# Patient Record
Sex: Female | Born: 1960 | Race: White | Hispanic: No | Marital: Married | Smoking: Never smoker
Health system: Southern US, Community
[De-identification: ages and names within clinical notes are randomized; demographics above are authoritative.]

## PROBLEM LIST (undated history)

## (undated) DIAGNOSIS — M1990 Unspecified osteoarthritis, unspecified site: Secondary | ICD-10-CM

## (undated) DIAGNOSIS — F419 Anxiety disorder, unspecified: Secondary | ICD-10-CM

## (undated) DIAGNOSIS — F32A Depression, unspecified: Secondary | ICD-10-CM

## (undated) DIAGNOSIS — T7840XA Allergy, unspecified, initial encounter: Secondary | ICD-10-CM

## (undated) DIAGNOSIS — I471 Supraventricular tachycardia, unspecified: Secondary | ICD-10-CM

## (undated) HISTORY — DX: Unspecified osteoarthritis, unspecified site: M19.90

## (undated) HISTORY — DX: Allergy, unspecified, initial encounter: T78.40XA

## (undated) HISTORY — PX: COLONOSCOPY: SHX174

## (undated) HISTORY — DX: Anxiety disorder, unspecified: F41.9

## (undated) HISTORY — DX: Depression, unspecified: F32.A

---

## 1992-11-19 HISTORY — PX: WISDOM TOOTH EXTRACTION: SHX21

## 1998-01-18 ENCOUNTER — Other Ambulatory Visit: Admission: RE | Admit: 1998-01-18 | Discharge: 1998-01-18 | Payer: Self-pay | Admitting: Obstetrics and Gynecology

## 1998-06-06 ENCOUNTER — Other Ambulatory Visit: Admission: RE | Admit: 1998-06-06 | Discharge: 1998-06-06 | Payer: Self-pay | Admitting: Obstetrics and Gynecology

## 1999-06-01 ENCOUNTER — Other Ambulatory Visit: Admission: RE | Admit: 1999-06-01 | Discharge: 1999-06-01 | Payer: Self-pay | Admitting: Obstetrics and Gynecology

## 1999-12-01 ENCOUNTER — Other Ambulatory Visit: Admission: RE | Admit: 1999-12-01 | Discharge: 1999-12-01 | Payer: Self-pay | Admitting: Obstetrics and Gynecology

## 2001-03-14 ENCOUNTER — Other Ambulatory Visit: Admission: RE | Admit: 2001-03-14 | Discharge: 2001-03-14 | Payer: Self-pay | Admitting: Obstetrics and Gynecology

## 2002-02-13 ENCOUNTER — Other Ambulatory Visit: Admission: RE | Admit: 2002-02-13 | Discharge: 2002-02-13 | Payer: Self-pay | Admitting: Obstetrics and Gynecology

## 2002-10-01 ENCOUNTER — Other Ambulatory Visit: Admission: RE | Admit: 2002-10-01 | Discharge: 2002-10-01 | Payer: Self-pay | Admitting: Obstetrics and Gynecology

## 2003-10-22 ENCOUNTER — Other Ambulatory Visit: Admission: RE | Admit: 2003-10-22 | Discharge: 2003-10-22 | Payer: Self-pay | Admitting: Obstetrics and Gynecology

## 2005-01-26 ENCOUNTER — Other Ambulatory Visit: Admission: RE | Admit: 2005-01-26 | Discharge: 2005-01-26 | Payer: Self-pay | Admitting: Obstetrics and Gynecology

## 2005-09-28 ENCOUNTER — Encounter: Admission: RE | Admit: 2005-09-28 | Discharge: 2005-09-28 | Payer: Self-pay | Admitting: Obstetrics and Gynecology

## 2005-10-09 ENCOUNTER — Encounter: Admission: RE | Admit: 2005-10-09 | Discharge: 2005-10-09 | Payer: Self-pay | Admitting: Obstetrics and Gynecology

## 2006-03-01 ENCOUNTER — Other Ambulatory Visit: Admission: RE | Admit: 2006-03-01 | Discharge: 2006-03-01 | Payer: Self-pay | Admitting: Obstetrics and Gynecology

## 2006-10-04 ENCOUNTER — Encounter: Admission: RE | Admit: 2006-10-04 | Discharge: 2006-10-04 | Payer: Self-pay | Admitting: Obstetrics and Gynecology

## 2007-03-24 ENCOUNTER — Encounter: Admission: RE | Admit: 2007-03-24 | Discharge: 2007-03-24 | Payer: Self-pay | Admitting: Obstetrics and Gynecology

## 2009-06-09 ENCOUNTER — Encounter: Admission: RE | Admit: 2009-06-09 | Discharge: 2009-06-09 | Payer: Self-pay | Admitting: Obstetrics and Gynecology

## 2009-06-22 ENCOUNTER — Ambulatory Visit: Payer: Self-pay | Admitting: Internal Medicine

## 2009-07-05 ENCOUNTER — Ambulatory Visit: Payer: Self-pay | Admitting: Internal Medicine

## 2009-07-05 ENCOUNTER — Encounter: Payer: Self-pay | Admitting: Internal Medicine

## 2009-07-07 ENCOUNTER — Encounter: Payer: Self-pay | Admitting: Internal Medicine

## 2010-06-15 ENCOUNTER — Encounter: Admission: RE | Admit: 2010-06-15 | Discharge: 2010-06-15 | Payer: Self-pay | Admitting: Obstetrics and Gynecology

## 2010-06-20 ENCOUNTER — Encounter: Admission: RE | Admit: 2010-06-20 | Discharge: 2010-06-20 | Payer: Self-pay | Admitting: Obstetrics and Gynecology

## 2010-12-09 ENCOUNTER — Encounter: Payer: Self-pay | Admitting: Obstetrics and Gynecology

## 2011-05-24 ENCOUNTER — Other Ambulatory Visit: Payer: Self-pay | Admitting: Obstetrics and Gynecology

## 2011-05-24 DIAGNOSIS — Z1231 Encounter for screening mammogram for malignant neoplasm of breast: Secondary | ICD-10-CM

## 2011-06-26 ENCOUNTER — Ambulatory Visit
Admission: RE | Admit: 2011-06-26 | Discharge: 2011-06-26 | Disposition: A | Discharge status: Home / Self Care | Payer: BC Managed Care – PPO | Source: Ambulatory Visit | Attending: Obstetrics and Gynecology | Admitting: Obstetrics and Gynecology

## 2011-06-26 DIAGNOSIS — Z1231 Encounter for screening mammogram for malignant neoplasm of breast: Secondary | ICD-10-CM

## 2012-04-25 ENCOUNTER — Other Ambulatory Visit: Payer: Self-pay | Admitting: Obstetrics and Gynecology

## 2012-04-25 DIAGNOSIS — N63 Unspecified lump in unspecified breast: Secondary | ICD-10-CM

## 2012-05-02 ENCOUNTER — Ambulatory Visit
Admission: RE | Admit: 2012-05-02 | Discharge: 2012-05-02 | Disposition: A | Discharge status: Home / Self Care | Payer: BC Managed Care – PPO | Source: Ambulatory Visit | Attending: Obstetrics and Gynecology | Admitting: Obstetrics and Gynecology

## 2012-05-02 DIAGNOSIS — N63 Unspecified lump in unspecified breast: Secondary | ICD-10-CM

## 2013-04-27 ENCOUNTER — Other Ambulatory Visit: Payer: Self-pay

## 2013-04-27 DIAGNOSIS — Z1231 Encounter for screening mammogram for malignant neoplasm of breast: Secondary | ICD-10-CM

## 2013-05-27 ENCOUNTER — Other Ambulatory Visit: Payer: Self-pay | Admitting: Obstetrics and Gynecology

## 2013-05-27 ENCOUNTER — Ambulatory Visit
Admission: RE | Admit: 2013-05-27 | Discharge: 2013-05-27 | Disposition: A | Discharge status: Home / Self Care | Payer: BC Managed Care – PPO | Source: Ambulatory Visit

## 2013-05-27 DIAGNOSIS — Z1231 Encounter for screening mammogram for malignant neoplasm of breast: Secondary | ICD-10-CM

## 2013-05-27 DIAGNOSIS — N644 Mastodynia: Secondary | ICD-10-CM

## 2013-05-27 DIAGNOSIS — N63 Unspecified lump in unspecified breast: Secondary | ICD-10-CM

## 2013-06-09 ENCOUNTER — Ambulatory Visit
Admission: RE | Admit: 2013-06-09 | Discharge: 2013-06-09 | Disposition: A | Discharge status: Home / Self Care | Payer: BC Managed Care – PPO | Source: Ambulatory Visit | Attending: Obstetrics and Gynecology | Admitting: Obstetrics and Gynecology

## 2013-06-09 DIAGNOSIS — N644 Mastodynia: Secondary | ICD-10-CM

## 2014-01-28 ENCOUNTER — Emergency Department (INDEPENDENT_AMBULATORY_CARE_PROVIDER_SITE_OTHER)
Admission: EM | Admit: 2014-01-28 | Discharge: 2014-01-28 | Disposition: A | Discharge status: Home / Self Care | Payer: Worker's Compensation | Source: Home / Self Care

## 2014-01-28 ENCOUNTER — Emergency Department (INDEPENDENT_AMBULATORY_CARE_PROVIDER_SITE_OTHER): Payer: Worker's Compensation

## 2014-01-28 ENCOUNTER — Encounter (HOSPITAL_COMMUNITY): Payer: Self-pay | Admitting: Emergency Medicine

## 2014-01-28 DIAGNOSIS — M79609 Pain in unspecified limb: Secondary | ICD-10-CM

## 2014-01-28 DIAGNOSIS — M79641 Pain in right hand: Secondary | ICD-10-CM

## 2014-01-28 DIAGNOSIS — M533 Sacrococcygeal disorders, not elsewhere classified: Secondary | ICD-10-CM

## 2014-01-28 HISTORY — DX: Supraventricular tachycardia: I47.1

## 2014-01-28 HISTORY — DX: Supraventricular tachycardia, unspecified: I47.10

## 2014-01-28 NOTE — ED Provider Notes (Signed)
Medical screening examination/treatment/procedure(s) were performed by resident physician or non-physician practitioner and as supervising physician I was immediately available for consultation/collaboration.   Pauline Good MD.   Billy Fischer, MD 01/28/14 [phone]

## 2014-01-28 NOTE — ED Provider Notes (Signed)
CSN: [id number]     Arrival date & time 01/28/14  1711 History   None    Chief Complaint  Patient presents with  . Hand Injury   (Consider location/radiation/quality/duration/timing/severity/associated sxs/prior Treatment) HPI Comments: 54 yo female slipped and fell at work on hard floor. She landed on her butt and right hand. She notes she did hit her head but denies LOC or pain or neuro changes. She notes pain in base of right thumb and fall was with hand flexed backwards.She is right hand dominant.She notes coccyx is sore when climbs stairs or sits for long periods of time. She denies radiation of pain in either area.  Patient is a 53 y.o. female presenting with hand injury.  Hand Injury   Past Medical History  Diagnosis Date  . SVT (supraventricular tachycardia)    Past Surgical History  Procedure Laterality Date  . Cesarean section     No family history on file. History  Substance Use Topics  . Smoking status: Never Smoker   . Smokeless tobacco: Not on file  . Alcohol Use: Yes     Comment: occasionally   OB History   Grav Para Term Preterm Abortions TAB SAB Ect Mult Living                 Review of Systems  Musculoskeletal:       Hand/ coccyx pain  All other systems reviewed and are negative.    Allergies  Review of patient's allergies indicates no known allergies.  Home Medications   Current Outpatient Rx  Name  Route  Sig  Dispense  Refill  . cetirizine (ZYRTEC) 10 MG tablet   Oral   Take 10 mg by mouth daily.         Marland Kitchen estradiol-norethindrone (COMBIPATCH) 0.05-0.14 MG/DAY   Transdermal   Place 1 patch onto the skin 2 (two) times a week.         Marland Kitchen ibuprofen (ADVIL,MOTRIN) 100 MG tablet   Oral   Take 100 mg by mouth every 6 (six) hours as needed for fever.          BP 145/79  Pulse 89  Temp(Src) 98.7 F (37.1 C)  Resp 16  SpO2 100% Physical Exam  Nursing note and vitals reviewed. Constitutional: She is oriented to person, place, and  time. She appears well-developed and well-nourished.  HENT:  Head: Normocephalic and atraumatic.  Right Ear: External ear normal.  Left Ear: External ear normal.  Nose: Nose normal.  Mouth/Throat: Oropharynx is clear and moist.  Eyes: Conjunctivae and EOM are normal. Pupils are equal, round, and reactive to light.  Neck: Normal range of motion. Neck supple.  Cardiovascular: Normal rate, regular rhythm, normal heart sounds and intact distal pulses.   Pulmonary/Chest: Effort normal and breath sounds normal.  Abdominal: Soft. Bowel sounds are normal. She exhibits no distension. There is no tenderness.  Musculoskeletal: She exhibits edema and tenderness.  Base of right thumb  Neurological: She is alert and oriented to person, place, and time. She has normal reflexes. No cranial nerve deficit. Coordination normal.  Skin: Skin is warm and dry.  Psychiatric: She has a normal mood and affect. Judgment normal.    ED Course  Procedures (including critical care time) Labs Review Labs Reviewed - No data to display Imaging Review Dg Hand Complete Right  01/28/2014   CLINICAL DATA:  Traumatic injury and pain  EXAM: RIGHT HAND - COMPLETE 3+ VIEW  COMPARISON:  None.  FINDINGS: Mild  degenerative changes of the first MCP joint are seen. No acute fracture or dislocation is noted. No gross soft tissue abnormality is noted.  IMPRESSION: Mild degenerative change without acute abnormality.   Electronically Signed   By: Inez Catalina M.D.   On: 01/28/2014 18:16     MDM  1. Right hand pain s/p fall- advised R.I.C.E, f/u if symptoms in crease for referral to hand specialist, ADvised OTC ADvil/ Tylenol AD 2. ? Bruised Coccyx- will monitor for now if symptoms increase f/u for Xray. Advised use cushion or donut pillow.     Ardis Hughs, PA-C 01/28/14 1926

## 2014-01-28 NOTE — ED Notes (Signed)
Patient states she fell this morning landing on her right hand and tail bone; states pain in tail bone is worse while sitting and going up stairs; states her hand hurts and can not mover her right thumb.

## 2014-04-21 ENCOUNTER — Encounter: Payer: Self-pay | Admitting: Internal Medicine

## 2014-05-24 ENCOUNTER — Other Ambulatory Visit: Payer: Self-pay

## 2014-05-24 DIAGNOSIS — Z1231 Encounter for screening mammogram for malignant neoplasm of breast: Secondary | ICD-10-CM

## 2014-06-10 ENCOUNTER — Encounter (INDEPENDENT_AMBULATORY_CARE_PROVIDER_SITE_OTHER): Payer: Self-pay

## 2014-06-10 ENCOUNTER — Ambulatory Visit
Admission: RE | Admit: 2014-06-10 | Discharge: 2014-06-10 | Disposition: A | Discharge status: Home / Self Care | Payer: BC Managed Care – PPO | Source: Ambulatory Visit

## 2014-06-10 DIAGNOSIS — Z1231 Encounter for screening mammogram for malignant neoplasm of breast: Secondary | ICD-10-CM

## 2014-06-30 ENCOUNTER — Other Ambulatory Visit: Payer: Self-pay | Admitting: Obstetrics and Gynecology

## 2014-07-01 LAB — CYTOLOGY - PAP

## 2014-11-19 HISTORY — PX: HAND SURGERY: SHX662

## 2015-05-12 ENCOUNTER — Encounter: Payer: Self-pay | Admitting: Internal Medicine

## 2015-07-01 ENCOUNTER — Other Ambulatory Visit: Payer: Self-pay

## 2015-07-01 DIAGNOSIS — Z1231 Encounter for screening mammogram for malignant neoplasm of breast: Secondary | ICD-10-CM

## 2015-07-04 ENCOUNTER — Other Ambulatory Visit: Payer: Self-pay | Admitting: Obstetrics and Gynecology

## 2015-07-05 LAB — CYTOLOGY - PAP

## 2015-07-11 ENCOUNTER — Ambulatory Visit
Admission: RE | Admit: 2015-07-11 | Discharge: 2015-07-11 | Disposition: A | Discharge status: Home / Self Care | Payer: BC Managed Care – PPO | Source: Ambulatory Visit

## 2015-07-11 DIAGNOSIS — Z1231 Encounter for screening mammogram for malignant neoplasm of breast: Secondary | ICD-10-CM

## 2016-06-25 ENCOUNTER — Other Ambulatory Visit: Payer: Self-pay | Admitting: Obstetrics and Gynecology

## 2016-06-25 DIAGNOSIS — Z1231 Encounter for screening mammogram for malignant neoplasm of breast: Secondary | ICD-10-CM

## 2016-07-17 ENCOUNTER — Ambulatory Visit: Payer: Self-pay

## 2016-07-17 ENCOUNTER — Ambulatory Visit
Admission: RE | Admit: 2016-07-17 | Discharge: 2016-07-17 | Disposition: A | Discharge status: Home / Self Care | Payer: BC Managed Care – PPO | Source: Ambulatory Visit | Attending: Obstetrics and Gynecology | Admitting: Obstetrics and Gynecology

## 2016-07-17 DIAGNOSIS — Z1231 Encounter for screening mammogram for malignant neoplasm of breast: Secondary | ICD-10-CM

## 2017-07-11 ENCOUNTER — Other Ambulatory Visit: Payer: Self-pay | Admitting: Obstetrics and Gynecology

## 2017-07-11 DIAGNOSIS — Z1231 Encounter for screening mammogram for malignant neoplasm of breast: Secondary | ICD-10-CM

## 2017-08-01 ENCOUNTER — Ambulatory Visit: Payer: Self-pay

## 2017-08-09 ENCOUNTER — Inpatient Hospital Stay: Admission: RE | Admit: 2017-08-09 | Payer: Self-pay | Source: Ambulatory Visit

## 2017-10-04 ENCOUNTER — Ambulatory Visit
Admission: RE | Admit: 2017-10-04 | Discharge: 2017-10-04 | Disposition: A | Discharge status: Home / Self Care | Payer: BC Managed Care – PPO | Source: Ambulatory Visit | Attending: Obstetrics and Gynecology | Admitting: Obstetrics and Gynecology

## 2017-10-04 DIAGNOSIS — Z1231 Encounter for screening mammogram for malignant neoplasm of breast: Secondary | ICD-10-CM

## 2018-09-10 ENCOUNTER — Other Ambulatory Visit: Payer: Self-pay | Admitting: Obstetrics and Gynecology

## 2018-09-10 DIAGNOSIS — Z1231 Encounter for screening mammogram for malignant neoplasm of breast: Secondary | ICD-10-CM

## 2018-10-20 ENCOUNTER — Ambulatory Visit
Admission: RE | Admit: 2018-10-20 | Discharge: 2018-10-20 | Disposition: A | Discharge status: Home / Self Care | Payer: BC Managed Care – PPO | Source: Ambulatory Visit | Attending: Obstetrics and Gynecology | Admitting: Obstetrics and Gynecology

## 2018-10-20 DIAGNOSIS — Z1231 Encounter for screening mammogram for malignant neoplasm of breast: Secondary | ICD-10-CM

## 2019-09-14 ENCOUNTER — Other Ambulatory Visit: Payer: Self-pay | Admitting: Obstetrics and Gynecology

## 2019-09-14 DIAGNOSIS — Z1231 Encounter for screening mammogram for malignant neoplasm of breast: Secondary | ICD-10-CM

## 2019-11-05 ENCOUNTER — Ambulatory Visit
Admission: RE | Admit: 2019-11-05 | Discharge: 2019-11-05 | Disposition: A | Discharge status: Home / Self Care | Payer: BC Managed Care – PPO | Source: Ambulatory Visit | Attending: Obstetrics and Gynecology | Admitting: Obstetrics and Gynecology

## 2019-11-05 ENCOUNTER — Other Ambulatory Visit: Payer: Self-pay

## 2019-11-05 DIAGNOSIS — Z1231 Encounter for screening mammogram for malignant neoplasm of breast: Secondary | ICD-10-CM

## 2019-12-31 ENCOUNTER — Encounter: Payer: Self-pay | Admitting: Obstetrics and Gynecology

## 2020-01-14 ENCOUNTER — Ambulatory Visit: Payer: BC Managed Care – PPO

## 2020-01-15 ENCOUNTER — Ambulatory Visit: Payer: BC Managed Care – PPO | Attending: Internal Medicine

## 2020-01-15 DIAGNOSIS — Z23 Encounter for immunization: Secondary | ICD-10-CM | POA: Insufficient documentation

## 2020-01-15 NOTE — Progress Notes (Signed)
   Covid-19 Vaccination Clinic  Name:  Claire Lawrence    MRN: [id number] DOB: August 03, 1961  01/15/2020  [name] was observed post Covid-19 immunization for 15 minutes without incidence. She was provided with Vaccine Information Sheet and instruction to access the V-Safe system.   [name] was instructed to call 911 with any severe reactions post vaccine: [name] Difficulty breathing  . Swelling of your face and throat  . A fast heartbeat  . A bad rash all over your body  . Dizziness and weakness    Immunizations Administered    Name Date Dose VIS Date Route   Pfizer COVID-19 Vaccine 01/15/2020  8:30 AM 0.3 mL 10/30/2019 Intramuscular   Manufacturer: [place]   Lot: [id number]   [place]: [id number]

## 2020-02-09 ENCOUNTER — Ambulatory Visit: Payer: BC Managed Care – PPO | Attending: Internal Medicine

## 2020-02-09 DIAGNOSIS — Z23 Encounter for immunization: Secondary | ICD-10-CM

## 2020-02-09 NOTE — Progress Notes (Signed)
   Covid-19 Vaccination Clinic  Name:  Claire Lawrence    MRN: [id number] DOB: 1961/02/18  02/09/2020  Claire Lawrence was observed post Covid-19 immunization for 15 minutes without incident. She was provided with Vaccine Information Sheet and instruction to access the V-Safe system.   Claire Lawrence was instructed to call 911 with any severe reactions post vaccine: Marland Kitchen Difficulty breathing  . Swelling of face and throat  . A fast heartbeat  . A bad rash all over body  . Dizziness and weakness   Immunizations Administered    Name Date Dose VIS Date Route   Pfizer COVID-19 Vaccine 02/09/2020 10:01 AM 0.3 mL 10/30/2019 Intramuscular   Manufacturer: [place]   Lot: [id number]   [place]: [id number]

## 2020-05-04 ENCOUNTER — Encounter: Payer: Self-pay | Admitting: Gastroenterology

## 2020-06-01 ENCOUNTER — Ambulatory Visit (AMBULATORY_SURGERY_CENTER): Payer: Self-pay

## 2020-06-01 ENCOUNTER — Other Ambulatory Visit: Payer: Self-pay

## 2020-06-01 VITALS — Ht 66.0 in | Wt 147.0 lb

## 2020-06-01 DIAGNOSIS — Z1211 Encounter for screening for malignant neoplasm of colon: Secondary | ICD-10-CM

## 2020-06-01 DIAGNOSIS — Z86010 Personal history of colonic polyps: Secondary | ICD-10-CM

## 2020-06-01 MED ORDER — SUTAB 1479-225-188 MG PO TABS: 1.0000 | ORAL_TABLET | ORAL | 0 refills | Status: DC

## 2020-06-01 NOTE — Progress Notes (Signed)
No egg or soy allergy known to patient  No issues with past sedation with any surgeries or procedures No intubation problems in the past  No diet pills per patient No home 02 use per patient  No blood thinners per patient  Pt denies issues with constipation  No A fib or A flutter  EMMI video to [place] 19 guidelines implemented in PV today   Coupon given to pt in [facility] today   COVID vaccine completed on 01/2020 per pt;  Due to the COVID-19 pandemic we are asking patients to follow these guidelines. Please only bring one care partner. Please be aware that your care partner may wait in the car in the parking lot or if they feel like they will be too hot to wait in the car, they may wait in the lobby on the 4th floor. All care partners are required to wear a mask the entire time (we do not have any that we can provide them), they need to practice social distancing, and we will do a Covid check for all patient's and care partners when you arrive. Also we will check their temperature and your temperature. If the care partner waits in their car they need to stay in the parking lot the entire time and we will call them on their cell phone when the patient is ready for discharge so they can bring the car to the front of the building. Also all patient's will need to wear a mask into building.

## 2020-06-02 ENCOUNTER — Telehealth: Payer: Self-pay | Admitting: Gastroenterology

## 2020-06-02 ENCOUNTER — Encounter: Payer: Self-pay | Admitting: Gastroenterology

## 2020-06-02 NOTE — Telephone Encounter (Signed)
Patient is calling, states that the prep that was called in pharmacy, the pharmacy texted her telling her they was going to send [place] something to get the prep switched. She states she does not want prep switched that she wants to take the tablets. She asked if there was a different pharmacy she could send them to she would be okay with that.

## 2020-06-02 NOTE — Telephone Encounter (Signed)
Pt states was told by [facility] yesterday they didn't have Sutab, today she received a text saying they have her script ready- I explained to her to take the Sutab coupon she received in [facility] yesterday to [facility]- they can run it as cash if its a PA they need and it should take care of any PA and drop price to $40 - Explained to her if she get sto [facility] and its a liquid, for her to not purchase the liquid prep, call back and we will send the Sutab to another Pharmacy for her - she verbalized understanding

## 2020-06-13 ENCOUNTER — Encounter: Payer: Self-pay | Admitting: Gastroenterology

## 2020-06-13 ENCOUNTER — Ambulatory Visit (AMBULATORY_SURGERY_CENTER): Payer: BC Managed Care – PPO | Admitting: Gastroenterology

## 2020-06-13 ENCOUNTER — Other Ambulatory Visit: Payer: Self-pay

## 2020-06-13 VITALS — BP 107/52 | HR 69 | Temp 96.8°F | Resp 11 | Ht 66.0 in | Wt 147.0 lb

## 2020-06-13 DIAGNOSIS — D122 Benign neoplasm of ascending colon: Secondary | ICD-10-CM

## 2020-06-13 DIAGNOSIS — K621 Rectal polyp: Secondary | ICD-10-CM

## 2020-06-13 DIAGNOSIS — Z86010 Personal history of colonic polyps: Secondary | ICD-10-CM | POA: Diagnosis not present

## 2020-06-13 DIAGNOSIS — D128 Benign neoplasm of rectum: Secondary | ICD-10-CM

## 2020-06-13 DIAGNOSIS — K635 Polyp of colon: Secondary | ICD-10-CM | POA: Diagnosis not present

## 2020-06-13 MED ORDER — SODIUM CHLORIDE 0.9 % IV SOLN: 500.0000 mL | Freq: Once | INTRAVENOUS | Status: DC

## 2020-06-13 NOTE — Progress Notes (Signed)
Pt's states no medical or surgical changes since previsit or office visit.  CW - vitals 

## 2020-06-13 NOTE — Patient Instructions (Signed)
Thank you for allowing [place] to care for you today!  Await pathology results by mail, approximately 7-10 days.  Recommendation for next colonoscopy will made at that time,  Continue with your high fiber diet, Resume any medications today.  Return to your normal activities tomorrow, Tuesday 06/14/20.      YOU HAD AN ENDOSCOPIC PROCEDURE TODAY AT [facility]:   Refer to the procedure report that was given to you for any specific questions about what was found during the examination.  If the procedure report does not answer your questions, please call your gastroenterologist to clarify.  If you requested that your care partner not be given the details of your procedure findings, then the procedure report has been included in a sealed envelope for you to review at your convenience later.  YOU SHOULD EXPECT: Some feelings of bloating in the abdomen. Passage of more gas than usual.  Walking can help get rid of the air that was put into your GI tract during the procedure and reduce the bloating. If you had a lower endoscopy (such as a colonoscopy or flexible sigmoidoscopy) you may notice spotting of blood in your stool or on the toilet paper. If you underwent a bowel prep for your procedure, you may not have a normal bowel movement for a few days.  Please Note:  You might notice some irritation and congestion in your nose or some drainage.  This is from the oxygen used during your procedure.  There is no need for concern and it should clear up in a day or so.  SYMPTOMS TO REPORT IMMEDIATELY:   Following lower endoscopy (colonoscopy or flexible sigmoidoscopy):  Excessive amounts of blood in the stool  Significant tenderness or worsening of abdominal pains  Swelling of the abdomen that is new, acute  Fever of 100F or higher   For urgent or emergent issues, a gastroenterologist can be reached at any hour by calling [phone]. Do not use MyChart messaging for urgent concerns.     DIET:  We do recommend a small meal at first, but then you may proceed to your regular diet.  Drink plenty of fluids but you should avoid alcoholic beverages for 24 hours.  ACTIVITY:  You should plan to take it easy for the rest of today and you should NOT DRIVE or use heavy machinery until tomorrow (because of the sedation medicines used during the test).    FOLLOW UP: Our staff will call the number listed on your records 48-72 hours following your procedure to check on you and address any questions or concerns that you may have regarding the information given to you following your procedure. If we do not reach you, we will leave a message.  We will attempt to reach you two times.  During this call, we will ask if you have developed any symptoms of COVID 19. If you develop any symptoms (ie: fever, flu-like symptoms, shortness of breath, cough etc.) before then, please call [phone].  If you test positive for Covid 19 in the 2 weeks post procedure, please call and report this information to [place].    If any biopsies were taken you will be contacted by phone or by letter within the next 1-3 weeks.  Please call us at [phone] if you have not heard about the biopsies in 3 weeks.    SIGNATURES/CONFIDENTIALITY: You and/or your care partner have signed paperwork which will be entered into your electronic medical record.  These signatures  attest to the fact that that the information above on your After Visit Summary has been reviewed and is understood.  Full responsibility of the confidentiality of this discharge information lies with you and/or your care-partner.

## 2020-06-13 NOTE — Progress Notes (Signed)
pt tolerated well. VSS. awake and to recovery. Report given to RN.  

## 2020-06-13 NOTE — Op Note (Signed)
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                            Olevia Perches 2010 Medicines:                Monitored Anesthesia Care Procedure:                Pre-Anesthesia Assessment:                           - Prior to the procedure, a History and Physical                            was performed, and patient medications and                            allergies were reviewed. The patient's tolerance of                            previous anesthesia was also reviewed. The risks                            and benefits of the procedure and the sedation                            options and risks were discussed with the patient.                            All questions were answered, and informed consent                            was obtained. Prior Anticoagulants: The patient has                            taken no previous anticoagulant or antiplatelet                            agents. ASA Grade Assessment: II - A patient with                            mild systemic disease. After reviewing the risks  and benefits, the patient was deemed in                            satisfactory condition to undergo the procedure.                           After obtaining informed consent, the colonoscope                            was passed under direct vision. Throughout the                            procedure, the patient's blood pressure, pulse, and                            oxygen saturations were monitored continuously. The                             Colonoscope was introduced through the anus and                            advanced to the the cecum, identified by                            appendiceal orifice and ileocecal valve. A second                            forward view of the right colon was performed. The                            colonoscopy was performed without difficulty. The                            patient tolerated the procedure well. The quality                            of the bowel preparation was good. The ileocecal                            valve, appendiceal orifice, and rectum were                            photographed. Scope In: 8:10:41 AM Scope Out: 8:29:03 AM Total Procedure Duration: 0 hours 18 minutes 22 seconds  Findings:                 The perianal and digital rectal examinations were                            normal.                           Two sessile polyps were found in the rectum. The  polyps were 2 mm in size. These polyps were removed                            with a cold snare. Resection and retrieval were                            complete. Estimated blood loss was minimal.                           Two sessile polyps were found in the ascending                            colon. The polyps were 1 to 3 mm in size. These                            polyps were removed with a cold snare. Resection                            and retrieval were complete. Estimated blood loss                            was minimal.                           A 9 mm polyp was found in the ascending colon. The                            polyp was flat. The polyp was removed with a                            piecemeal technique using a cold snare. Resection                            and retrieval were complete. Estimated blood loss                            was minimal.                           The exam was otherwise without abnormality on                             direct and retroflexion views. Complications:            No immediate complications. Estimated blood loss:                            Minimal. Estimated Blood Loss:     Estimated blood loss was minimal. Impression:               - Two 2 mm polyps in the rectum, removed with a                            cold snare. Resected and retrieved.                           -  Two 1 to 3 mm polyps in the ascending colon,                            removed with a cold snare. Resected and retrieved.                           - One 9 mm polyp in the ascending colon, removed                            piecemeal using a cold snare. Resected and                            retrieved.                           - The examination was otherwise normal on direct                            and retroflexion views. Recommendation:           - Patient has a contact number available for                            emergencies. The signs and symptoms of potential                            delayed complications were discussed with the                            patient. Return to normal activities tomorrow.                            Written discharge instructions were provided to the                            patient.                           - Resume previous diet.                           - Continue present medications.                           - Await pathology results.                           - Repeat colonoscopy date to be determined after                            pending pathology results are reviewed for                            surveillance.                           -  Emerging evidence supports eating a diet of                            fruits, vegetables, grains, calcium, and yogurt                            while reducing red meat and alcohol may reduce the                            risk of colon cancer.                           - Thank you for allowing me to be involved in your                             colon cancer prevention. Thornton Park MD, MD 06/13/2020 8:36:44 AM This report has been signed electronically.

## 2020-06-13 NOTE — Progress Notes (Signed)
Called to room to assist during endoscopic procedure.  Patient ID and intended procedure confirmed with present staff. Received instructions for my participation in the procedure from the performing physician.  

## 2020-06-15 ENCOUNTER — Other Ambulatory Visit: Payer: Self-pay | Admitting: Family Medicine

## 2020-06-15 ENCOUNTER — Telehealth: Payer: Self-pay

## 2020-06-15 ENCOUNTER — Ambulatory Visit
Admission: RE | Admit: 2020-06-15 | Discharge: 2020-06-15 | Disposition: A | Discharge status: Home / Self Care | Payer: BC Managed Care – PPO | Source: Ambulatory Visit | Attending: Family Medicine | Admitting: Family Medicine

## 2020-06-15 DIAGNOSIS — M25562 Pain in left knee: Secondary | ICD-10-CM

## 2020-06-15 NOTE — Telephone Encounter (Signed)
°  Follow up Call-  Call back number 06/13/2020  Post procedure Call Back phone  # [phone]  Permission to leave phone message Yes  Some recent data might be hidden     Patient questions:  Do you have a fever, pain , or abdominal swelling? No. Pain Score  0 *  Have you tolerated food without any problems? Yes.    Have you been able to return to your normal activities? Yes.    Do you have any questions about your discharge instructions: Diet   No. Medications  No. Follow up visit  No.  Do you have questions or concerns about your Care? No.  Actions: * If pain score is 4 or above: No action needed, pain <4.   1. Have you developed a fever since your procedure? No   2.   Have you had an respiratory symptoms (SOB or cough) since your procedure? No   3.   Have you tested positive for COVID 19 since your procedure? No   4.   Have you had any family members/close contacts diagnosed with the COVID 19 since your procedure?  No    If yes to any of these questions please route to Joylene John, RN and Erenest Rasher, RN

## 2020-06-15 NOTE — Telephone Encounter (Signed)
Attempted to reach pt. With follow-up call following endoscopic procedure 06/13/2020.  LM on pt. Voice mail.  Will try to reach pt. Again later today.

## 2020-06-16 ENCOUNTER — Encounter: Payer: Self-pay | Admitting: Gastroenterology

## 2020-11-07 ENCOUNTER — Other Ambulatory Visit: Payer: Self-pay | Admitting: Obstetrics and Gynecology

## 2020-11-07 DIAGNOSIS — Z1231 Encounter for screening mammogram for malignant neoplasm of breast: Secondary | ICD-10-CM

## 2020-12-16 ENCOUNTER — Ambulatory Visit
Admission: RE | Admit: 2020-12-16 | Discharge: 2020-12-16 | Disposition: A | Discharge status: Home / Self Care | Payer: BC Managed Care – PPO | Source: Ambulatory Visit | Attending: Obstetrics and Gynecology | Admitting: Obstetrics and Gynecology

## 2020-12-16 ENCOUNTER — Other Ambulatory Visit: Payer: Self-pay

## 2020-12-16 DIAGNOSIS — Z1231 Encounter for screening mammogram for malignant neoplasm of breast: Secondary | ICD-10-CM

## 2021-11-28 ENCOUNTER — Other Ambulatory Visit: Payer: Self-pay | Admitting: Obstetrics and Gynecology

## 2021-11-28 DIAGNOSIS — Z1231 Encounter for screening mammogram for malignant neoplasm of breast: Secondary | ICD-10-CM

## 2021-12-20 ENCOUNTER — Other Ambulatory Visit: Payer: Self-pay

## 2021-12-20 ENCOUNTER — Ambulatory Visit
Admission: RE | Admit: 2021-12-20 | Discharge: 2021-12-20 | Disposition: A | Discharge status: Home / Self Care | Payer: BC Managed Care – PPO | Source: Ambulatory Visit | Attending: Obstetrics and Gynecology | Admitting: Obstetrics and Gynecology

## 2021-12-20 DIAGNOSIS — Z1231 Encounter for screening mammogram for malignant neoplasm of breast: Secondary | ICD-10-CM

## 2023-02-08 ENCOUNTER — Other Ambulatory Visit: Payer: Self-pay | Admitting: Obstetrics and Gynecology

## 2023-02-08 DIAGNOSIS — Z1231 Encounter for screening mammogram for malignant neoplasm of breast: Secondary | ICD-10-CM

## 2023-05-02 ENCOUNTER — Ambulatory Visit
Admission: RE | Admit: 2023-05-02 | Discharge: 2023-05-02 | Disposition: A | Discharge status: Home / Self Care | Payer: BC Managed Care – PPO | Source: Ambulatory Visit | Attending: Obstetrics and Gynecology | Admitting: Obstetrics and Gynecology

## 2023-05-02 DIAGNOSIS — Z1231 Encounter for screening mammogram for malignant neoplasm of breast: Secondary | ICD-10-CM

## 2024-01-03 IMAGING — MG MM DIGITAL SCREENING BILAT W/ TOMO AND CAD
6 of 10 series · 6 of 30 positions shown · non-contrast
Comparison: Previous exam(s).

CLINICAL DATA: Screening.

EXAM:
DIGITAL SCREENING BILATERAL MAMMOGRAM WITH TOMOSYNTHESIS AND CAD
TECHNIQUE: Bilateral screening digital craniocaudal and mediolateral oblique
mammograms were obtained. Bilateral screening digital breast
tomosynthesis was performed. The images were evaluated with
computer-aided detection.

[R CC synth-2D]
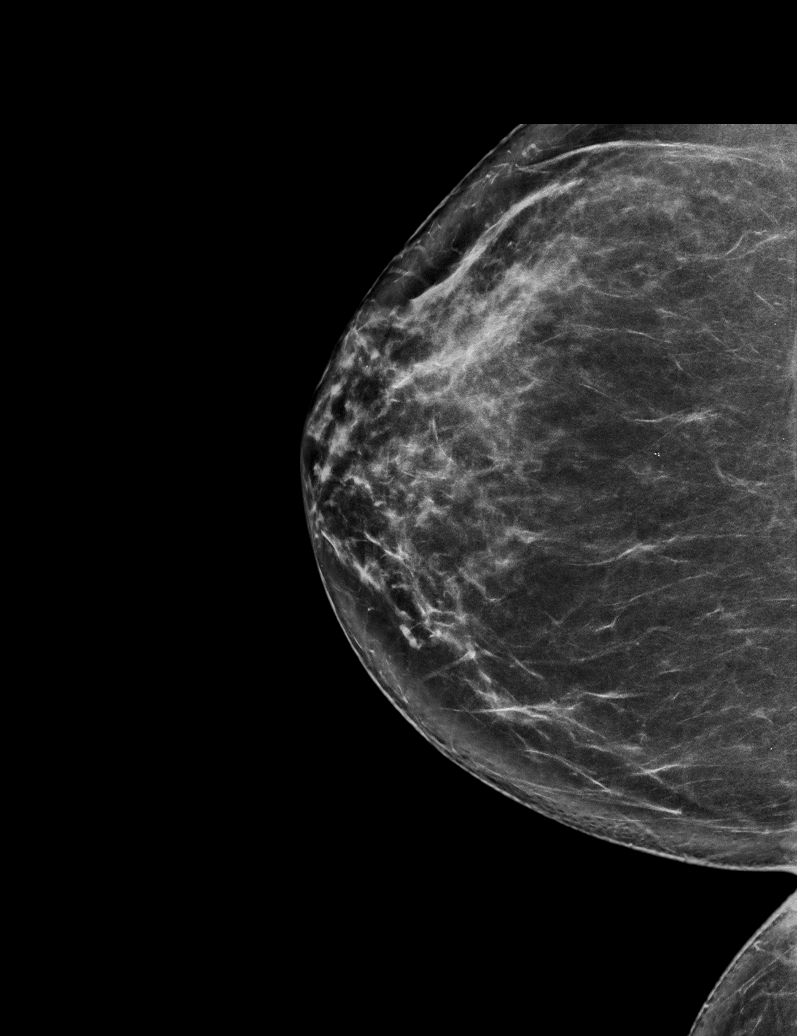

[R XCCL synth-2D]
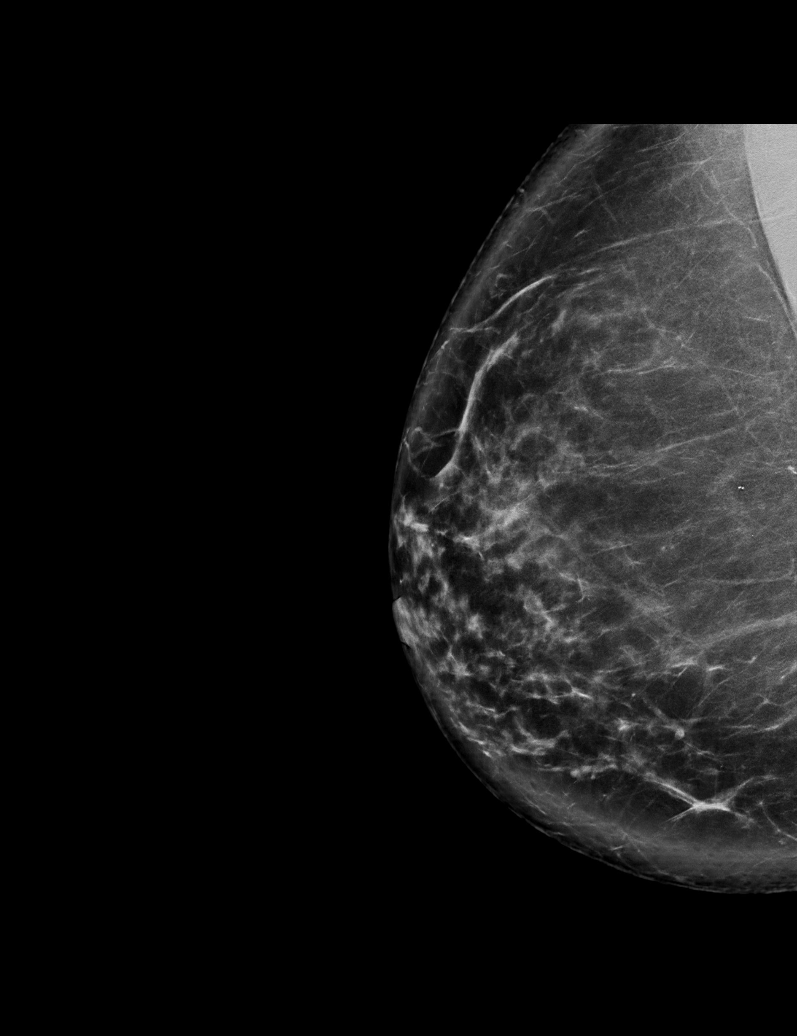

[R MLO synth-2D]
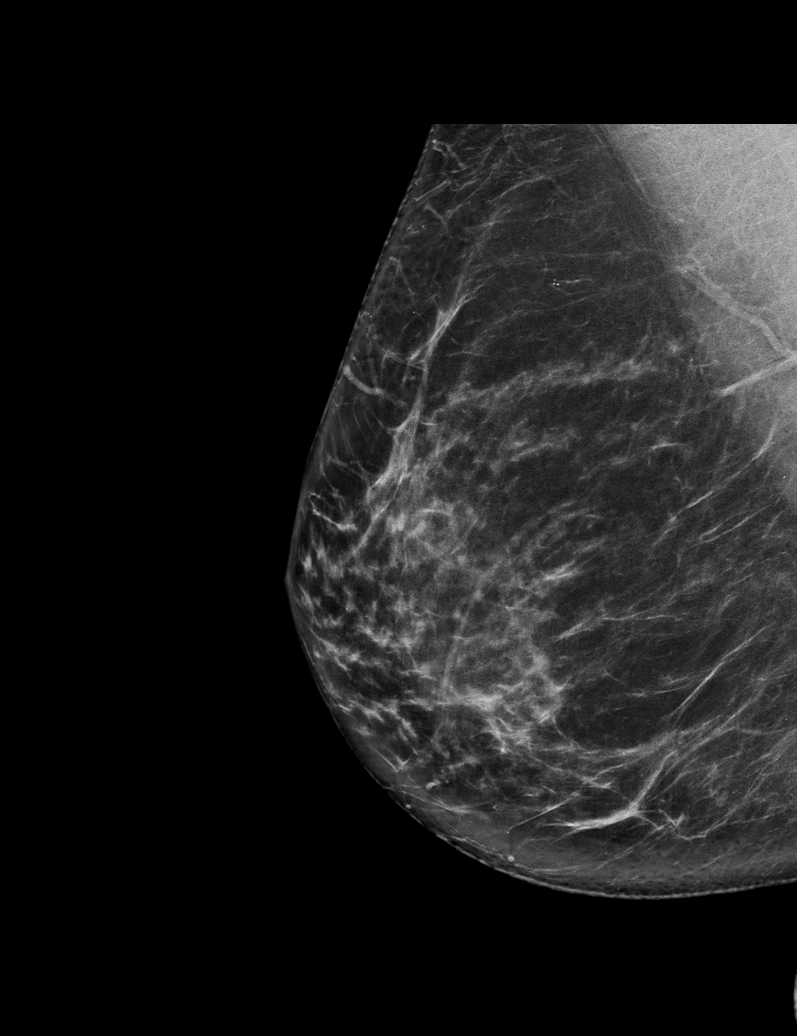

[L MLO synth-2D]
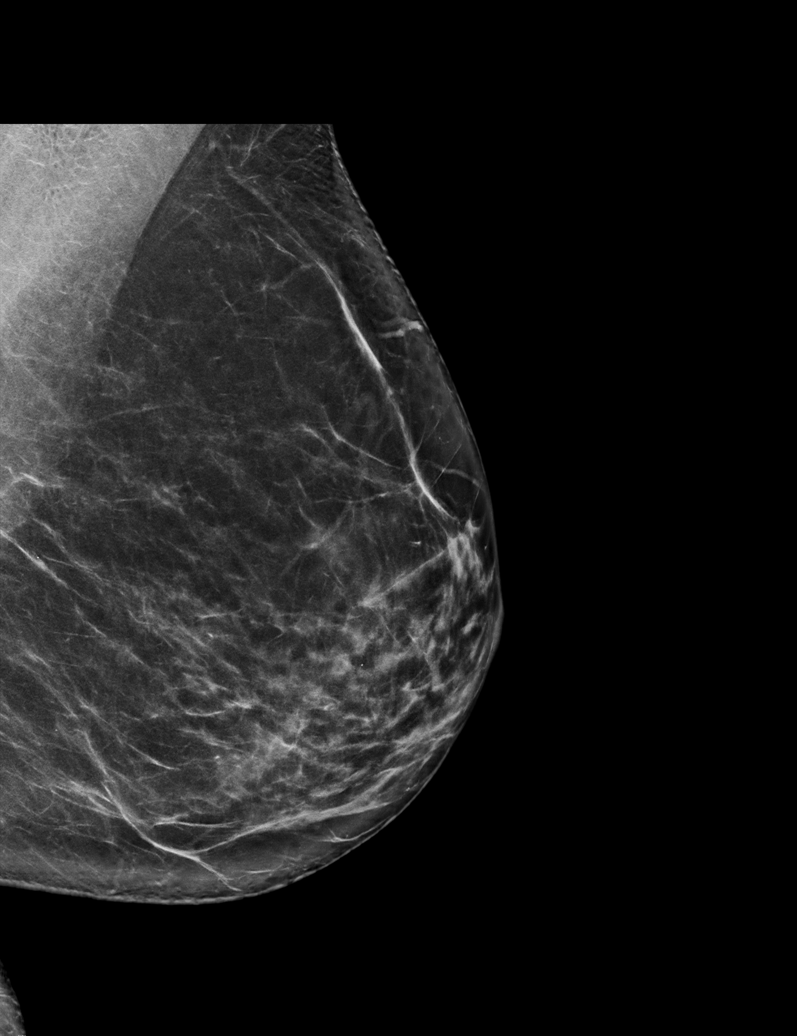

[L CC synth-2D]
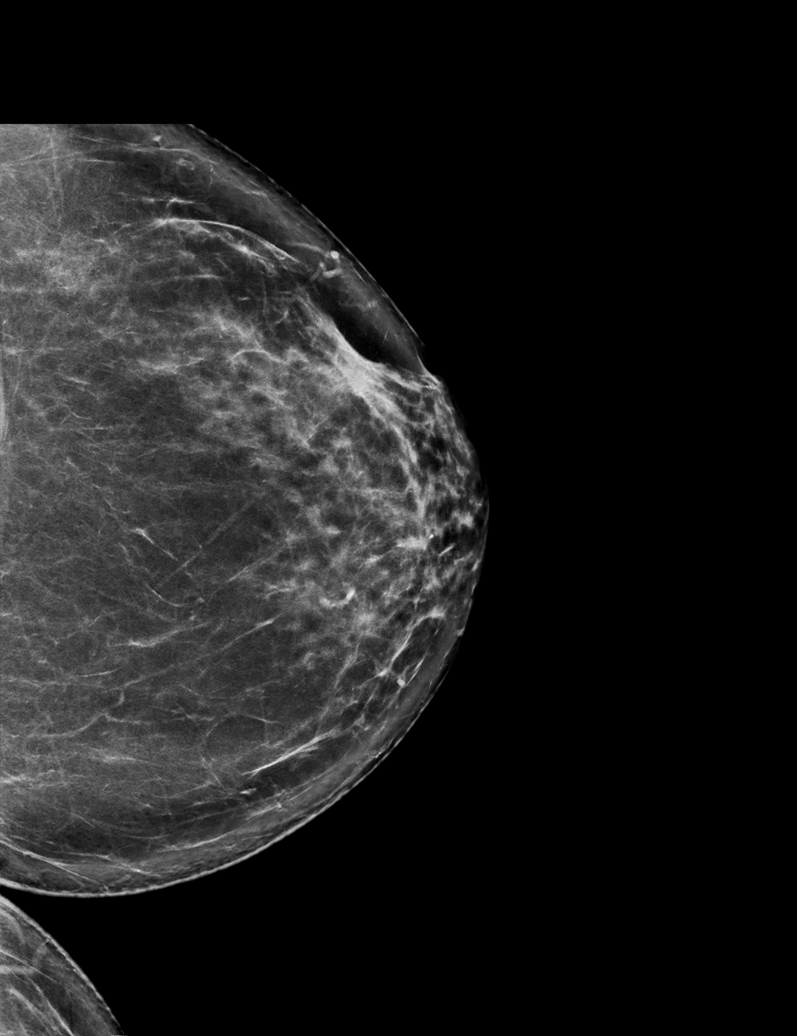

[L CC tomo · tomo slice 39/78.0]
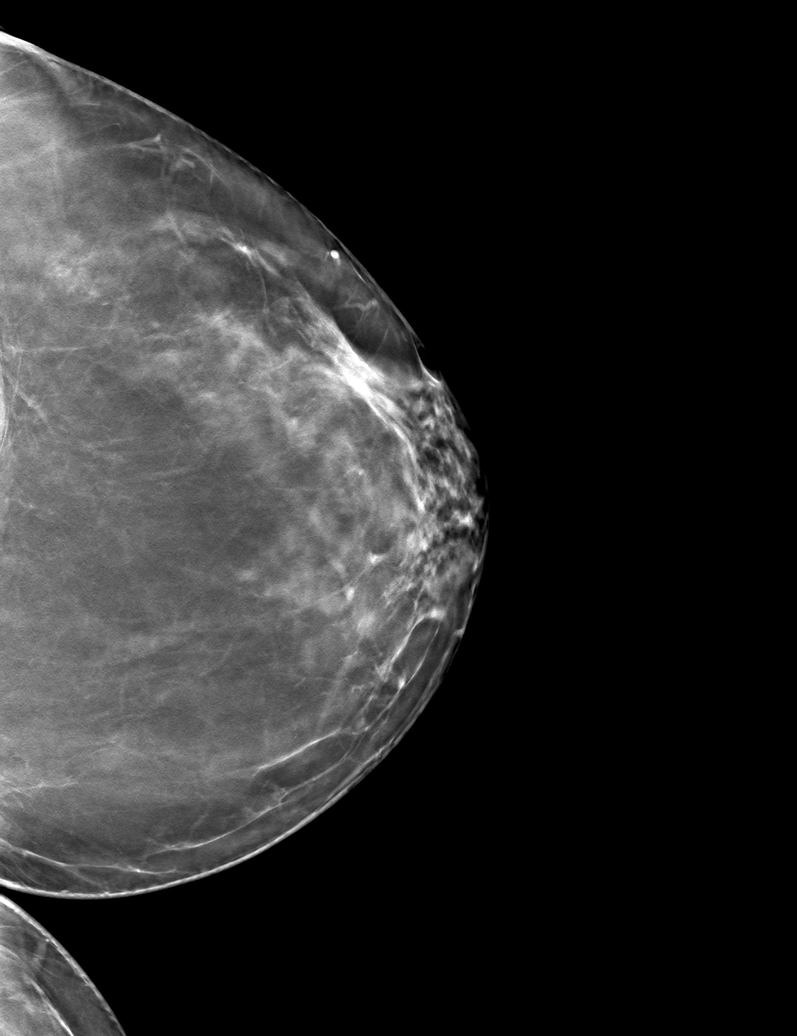

[6 of 30 positions shown; findings below may reference images not displayed]

ACR Breast Density Category b: There are scattered areas of
fibroglandular density.
FINDINGS: There are no findings suspicious for malignancy.
IMPRESSION: No mammographic evidence of malignancy. A result letter of this
screening mammogram will be mailed directly to the patient.

RECOMMENDATION:
Screening mammogram in one year. (Code:51-O-LD2)

BI-RADS CATEGORY  1: Negative.
